# Patient Record
Sex: Male | Born: 1950 | Race: Black or African American | Hispanic: No | Smoking: Current every day smoker
Health system: Southern US, Community
[De-identification: ages and names within clinical notes are randomized; demographics above are authoritative.]

## PROBLEM LIST (undated history)

## (undated) DIAGNOSIS — I509 Heart failure, unspecified: Secondary | ICD-10-CM

## (undated) DIAGNOSIS — I10 Essential (primary) hypertension: Secondary | ICD-10-CM

## (undated) DIAGNOSIS — K469 Unspecified abdominal hernia without obstruction or gangrene: Secondary | ICD-10-CM

## (undated) HISTORY — PX: HERNIA REPAIR: SHX51

---

## 2017-03-24 ENCOUNTER — Emergency Department: Payer: Medicare Other

## 2017-03-24 ENCOUNTER — Emergency Department
Admission: EM | Admit: 2017-03-24 | Discharge: 2017-03-24 | Disposition: A | Discharge status: Home / Self Care | Payer: Medicare Other | Attending: Emergency Medicine | Admitting: Emergency Medicine

## 2017-03-24 DIAGNOSIS — Y999 Unspecified external cause status: Secondary | ICD-10-CM | POA: Insufficient documentation

## 2017-03-24 DIAGNOSIS — I110 Hypertensive heart disease with heart failure: Secondary | ICD-10-CM | POA: Diagnosis not present

## 2017-03-24 DIAGNOSIS — S6991XA Unspecified injury of right wrist, hand and finger(s), initial encounter: Secondary | ICD-10-CM | POA: Diagnosis present

## 2017-03-24 DIAGNOSIS — I509 Heart failure, unspecified: Secondary | ICD-10-CM | POA: Insufficient documentation

## 2017-03-24 DIAGNOSIS — Y939 Activity, unspecified: Secondary | ICD-10-CM | POA: Insufficient documentation

## 2017-03-24 DIAGNOSIS — W0110XA Fall on same level from slipping, tripping and stumbling with subsequent striking against unspecified object, initial encounter: Secondary | ICD-10-CM | POA: Diagnosis not present

## 2017-03-24 DIAGNOSIS — M19041 Primary osteoarthritis, right hand: Secondary | ICD-10-CM | POA: Insufficient documentation

## 2017-03-24 DIAGNOSIS — Y929 Unspecified place or not applicable: Secondary | ICD-10-CM | POA: Diagnosis not present

## 2017-03-24 DIAGNOSIS — S60221A Contusion of right hand, initial encounter: Secondary | ICD-10-CM | POA: Insufficient documentation

## 2017-03-24 DIAGNOSIS — I10 Essential (primary) hypertension: Secondary | ICD-10-CM

## 2017-03-24 DIAGNOSIS — F17200 Nicotine dependence, unspecified, uncomplicated: Secondary | ICD-10-CM | POA: Insufficient documentation

## 2017-03-24 HISTORY — DX: Heart failure, unspecified: I50.9

## 2017-03-24 HISTORY — DX: Essential (primary) hypertension: I10

## 2017-03-24 HISTORY — DX: Unspecified abdominal hernia without obstruction or gangrene: K46.9

## 2017-03-24 MED ORDER — HYDROCODONE-ACETAMINOPHEN 5-325 MG PO TABS
1.0000 | ORAL_TABLET | Freq: Once | ORAL | Status: AC
  Administered 2017-03-24: 1 via ORAL
  Filled 2017-03-24: qty 1

## 2017-03-24 MED ORDER — NAPROXEN 500 MG PO TABS
500.0000 mg | ORAL_TABLET | Freq: Once | ORAL | Status: AC
  Administered 2017-03-24: 500 mg via ORAL
  Filled 2017-03-24: qty 1

## 2017-03-24 MED ORDER — TRAMADOL HCL 50 MG PO TABS: 50.0000 mg | ORAL_TABLET | Freq: Three times a day (TID) | ORAL | 0 refills | Status: AC | PRN

## 2017-03-24 NOTE — ED Notes (Signed)
Cab called for pt, ETA 20 minutes. Pt ambulatory w/o issue to lobby

## 2017-03-24 NOTE — Discharge Instructions (Signed)
Your x-ray was negative for any fracture (break) or dislocation of bones of the fingers or hand. Take the pain medicine as directed. Apply the ice pack to reduce swelling. [facility] [place] as [place]. Restart your blood pressure medicines as soon as possible.

## 2017-03-24 NOTE — ED Notes (Signed)
Pt sts that he would need cab called. Informed pt that he would have to pay for cab himself. Pt said that that is okay.

## 2017-03-24 NOTE — ED Triage Notes (Signed)
Pt reports to ED w/ c/o R hand pain r/t injury

## 2017-03-24 NOTE — ED Provider Notes (Signed)
[facility] Emergency Department Provider Note ____________________________________________  Time seen: 1838  I have reviewed the triage vital signs and the nursing notes.  HISTORY  Chief Complaint  Hand Injury  HPI Matthew Mccoy is a 66 y.o. male presents to the ED for evaluation of an accident or injury to the right hand. Patient describes drinking by himself and his "man cave," when he tripped, falling backwards, and hit his hand on his free weights. The initial injury occurred 3 days prior to arrival. Patient has continued to use his hand and presents now with pain, swelling, and disability to the right hand. He describes initially hitting the dorsal fourth and fifth digits, the swelling has now progressed through the fingers and to the dorsal aspect of the hand. He denies any other injury at this time. The patient also admits to being incarcerated for the last 13 months, has admittedly not been on his previously prescribed hypertensive medication. He also has not scheduled an appointment with the [facility] for routine follow-up.   Past Medical History:  Diagnosis Date  . CHF (congestive heart failure) (HCC)   . Hernia of abdominal cavity   . Hypertension     There are no active problems to display for this patient.   Past Surgical History:  Procedure Laterality Date  . HERNIA REPAIR      Prior to Admission medications   Medication Sig Start Date End Date Taking? Authorizing Provider  traMADol (ULTRAM) 50 MG tablet Take 1 tablet (50 mg total) by mouth 3 (three) times daily as needed. 03/24/17   Asianna Brundage, Charlesetta Ivory, PA-C    Allergies Patient has no known allergies.  No family history on file.  Social History Social History  Substance Use Topics  . Smoking status: Current Every Day Smoker    Packs/day: 0.50  . Smokeless tobacco: Never Used  . Alcohol use Not on file    Review of Systems  Constitutional: Negative for  fever. Cardiovascular: Negative for chest pain. Respiratory: Negative for shortness of breath. Musculoskeletal: Negative for back pain. Right hand pain and swelling as above Skin: Negative for rash. Neurological: Negative for headaches, focal weakness or numbness. ____________________________________________  PHYSICAL EXAM:  VITAL SIGNS: ED Triage Vitals [03/24/17 1827]  Enc Vitals Group     BP (!) 172/101     Pulse Rate (!) 108     Resp 20     Temp 99.1 F (37.3 C)     Temp Source Oral     SpO2 96 %     Weight 180 lb (81.6 kg)     Height 5\' 11"  (1.803 m)     Head Circumference      Peak Flow      Pain Score 9     Pain Loc      Pain Edu?      Excl. in GC?     Constitutional: Alert and oriented. Well appearing and in no distress. Head: Normocephalic and atraumatic. Cardiovascular: Normal rate, regular rhythm. Normal distal pulses. Respiratory: Normal respiratory effort.  Musculoskeletal: Right hand with obvious dorsal swelling noted to the fingers and dorsal MCPs. Patient with a chronic contracture deformity at the PIP of the right pinky. Decreased extension range of the digits of the right hand and decreased composite fist secondary to soft tissue swelling. Nontender with normal range of motion in all extremities.  Neurologic:  Normal gross sensation. Normal intrinsic and opposition testing. Normal speech and language. No gross focal  neurologic deficits are appreciated. Skin:  Skin is warm, dry and intact. No rash noted. Psychiatric: Mood and affect are normal. Patient exhibits appropriate insight and judgment. ____________________________________________   RADIOLOGY  Right Hand   IMPRESSION: 1. No acute displaced fracture 2. Arthropathy at the fifth PIP joint with small loose bodies or bone fragments at the PIP joint ; findings could be secondary to indolent infection ; MRI would be helpful for further evaluation.  I, Bayan Kushnir, Charlesetta IvoryJenise V Bacon, personally viewed and  evaluated these images (plain radiographs) as part of my medical decision making, as well as reviewing the written report by the radiologist. ____________________________________________  PROCEDURES  Norco 5-325 mg PO Naproxen 500 mg PO Ice pack  ____________________________________________  INITIAL IMPRESSION / ASSESSMENT AND PLAN / ED COURSE  Patient with evaluation of atraumatic, accidental contusion to the right hand. His x-rays negative for any acute fracture or dislocation. The patient has had significant osteoarthritis with small loose bodies at the fifth digit PIP. He is discharged with a prescription for Ultram to dose as directed. He is also given ice pack to apply to reduce swelling. He is advised to [facility] as scheduled. ____________________________________________  FINAL CLINICAL IMPRESSION(S) / ED DIAGNOSES  Final diagnoses:  Contusion of right hand, initial encounter  OA (osteoarthritis) of finger, right  Uncontrolled hypertension      Joanne Brander, Charlesetta IvoryJenise V Bacon, PA-C 03/24/17 1909    Loleta RoseForbach, Cory, MD 03/24/17 [phone]

## 2017-03-24 NOTE — ED Notes (Signed)
See triage note..the patient reports to ED w/ c/o R hand pain r/t injury. Pt sts that he had a mechanical fall x 3 days ago, injured hand at that time. Swelling noted to pts R hand, tender to palpation.  Pt sts that he was recently released from prison, has yet to set up PCP.  Pt denies CP, SOB, n/v/d, LOC, or dizziness. Pt ambulatory to room w/o issue. Resp even and unlabored. NAD

## 2017-03-24 NOTE — ED Notes (Signed)
FN: pt presents with right hand pain and swelling. States he fell three days ago. Swelling noted to right fourth and fifth digit on right hand.

## 2017-03-24 NOTE — ED Notes (Signed)
Pt sts that he has ride home. Informed pt he should call ride as discharge would be soon

## 2018-01-23 IMAGING — DX DG HAND COMPLETE 3+V*R*
3 series · 3 of 3 positions shown · non-contrast
Comparison: None.

CLINICAL DATA: Right hand pain and swelling

EXAM:
RIGHT HAND - COMPLETE 3+ VIEW

[hand ap]
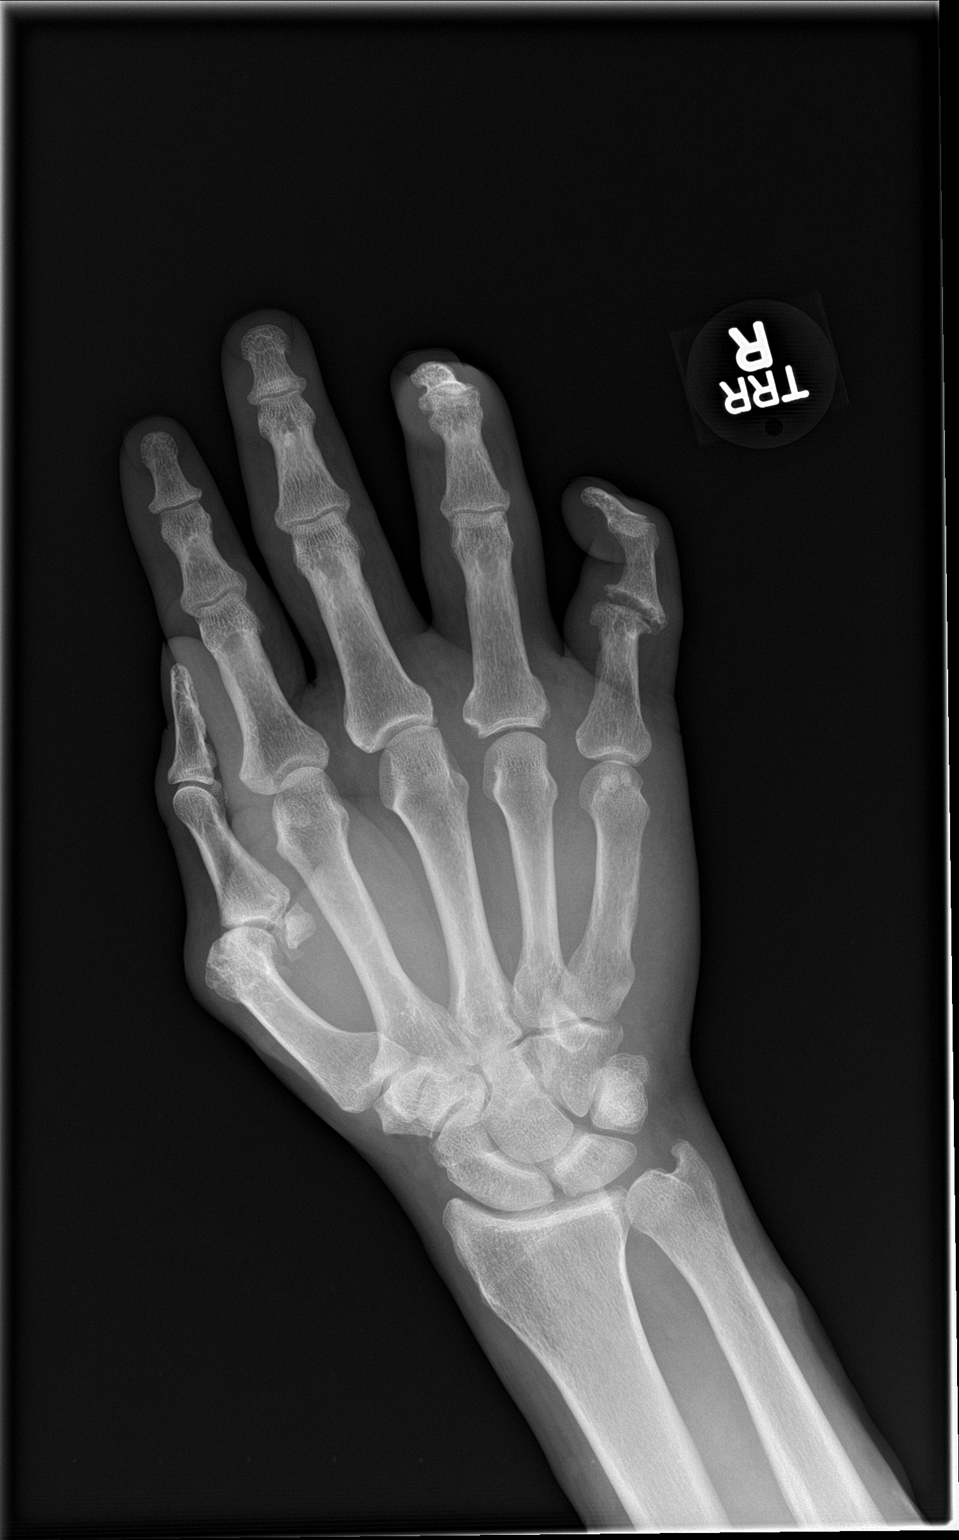

[hand obl]
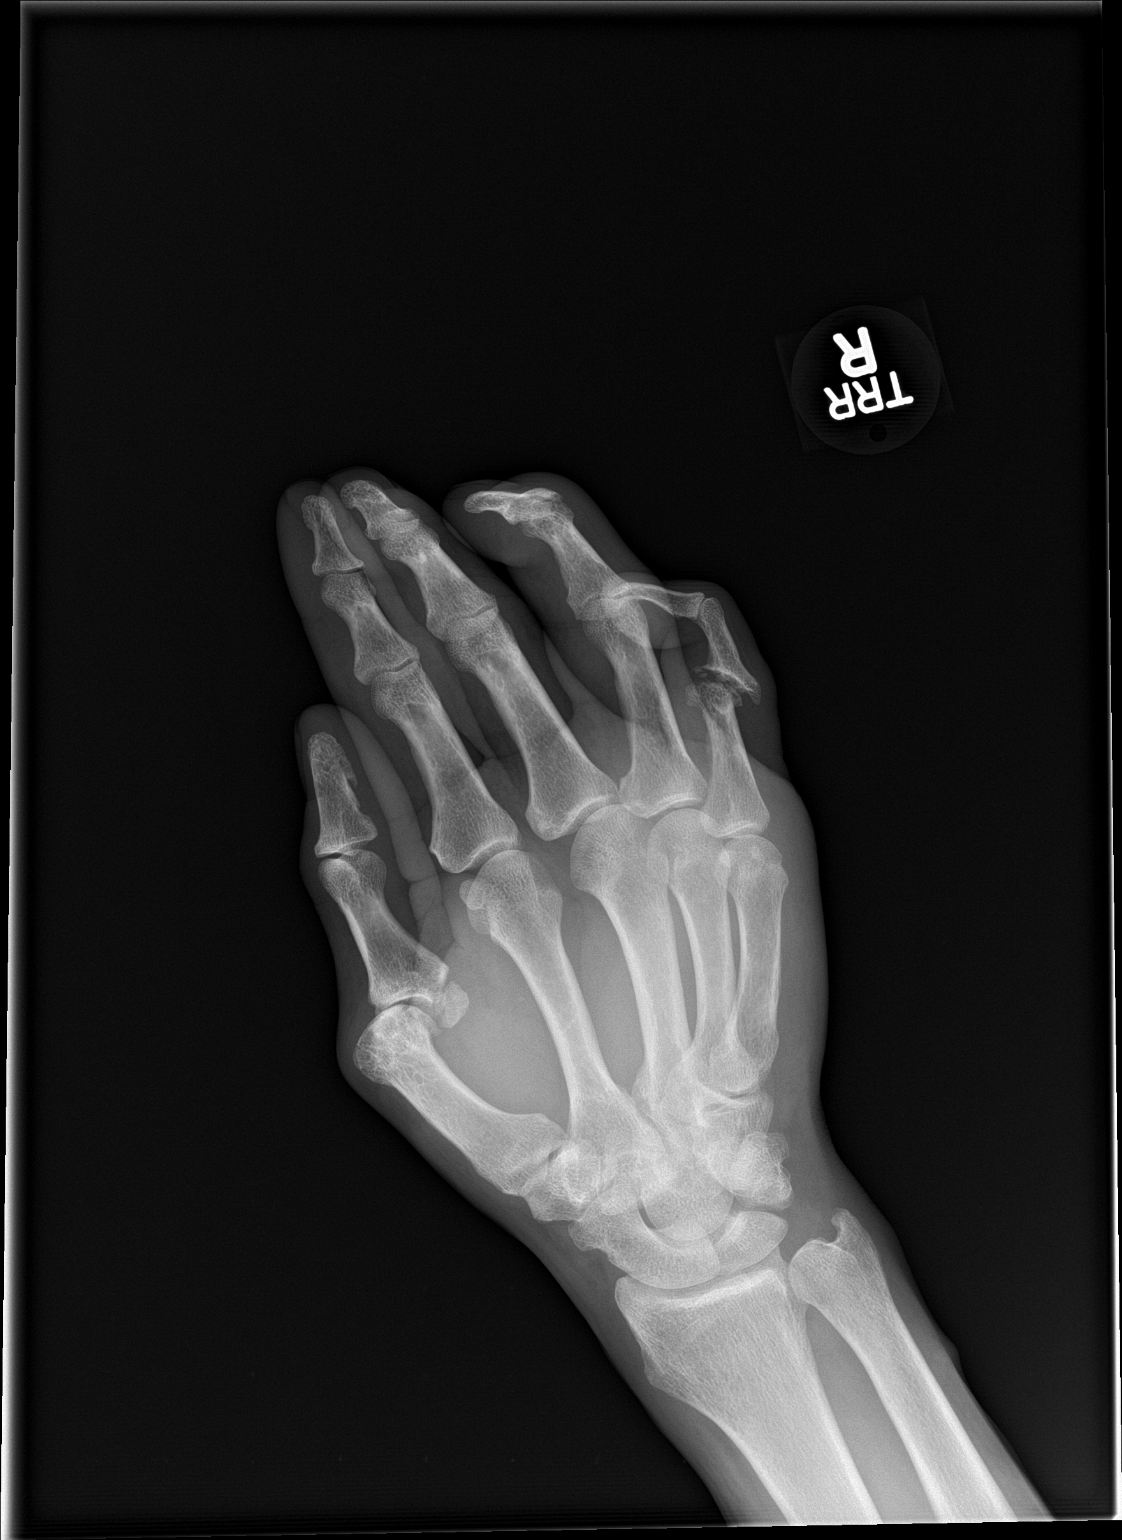

[hand lat]
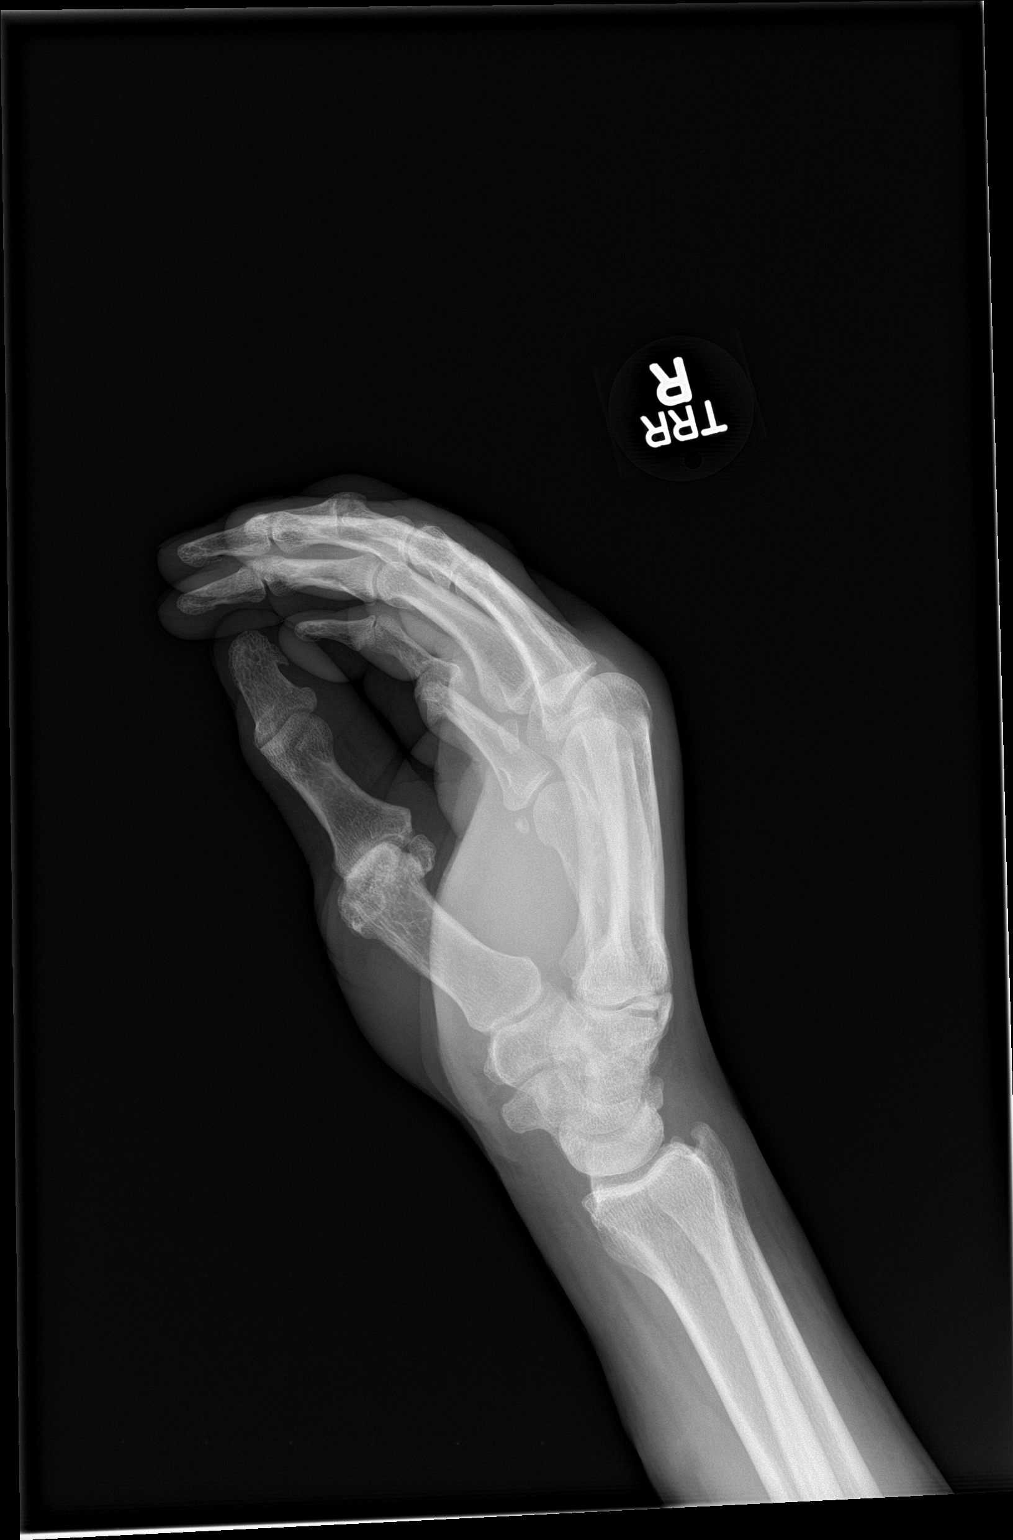

[3 of 3 positions shown; findings below may reference images not displayed]

FINDINGS: Examination is limited by the position Giorgi and flexion of the fourth
and fifth digits. No definite acute fracture is seen. Mild dorsal
subluxation at the fifth PIP joint with joint space widening and
deformity of the head of the fifth proximal phalanx and base of the
fifth middle phalanx. Generalized soft tissue swelling of the fourth
and fifth digits. Fragmentation or small loose bodies at the fifth
PIP joint. Degenerative changes at the first MCP joint.
IMPRESSION: 1. No acute displaced fracture
2. Arthropathy at the fifth PIP joint with small loose bodies or
bone fragments at the PIP joint ; findings could be secondary to
indolent infection ; MRI would be helpful for further evaluation.

## 2021-07-20 DEATH — deceased
